# Patient Record
Sex: Female | Born: 1995 | Race: Black or African American | Hispanic: No | Marital: Single | Smoking: Never smoker
Health system: Southern US, Community
[De-identification: ages and names within clinical notes are randomized; demographics above are authoritative.]

## PROBLEM LIST (undated history)

## (undated) HISTORY — PX: GYNECOLOGIC CRYOSURGERY: SHX857

---

## 2017-03-09 ENCOUNTER — Other Ambulatory Visit: Payer: Self-pay

## 2017-03-09 ENCOUNTER — Ambulatory Visit (HOSPITAL_COMMUNITY)
Admission: EM | Admit: 2017-03-09 | Discharge: 2017-03-09 | Disposition: A | Discharge status: Home / Self Care | Payer: 59

## 2017-03-09 ENCOUNTER — Encounter (HOSPITAL_COMMUNITY): Payer: Self-pay | Admitting: Emergency Medicine

## 2017-03-09 DIAGNOSIS — J069 Acute upper respiratory infection, unspecified: Secondary | ICD-10-CM

## 2017-03-09 MED ORDER — BENZONATATE 100 MG PO CAPS: 100.0000 mg | ORAL_CAPSULE | Freq: Three times a day (TID) | ORAL | 0 refills | Status: AC

## 2017-03-09 MED ORDER — ONDANSETRON HCL 4 MG PO TABS: 4.0000 mg | ORAL_TABLET | Freq: Four times a day (QID) | ORAL | 0 refills | Status: AC

## 2017-03-09 MED ORDER — DEXAMETHASONE SODIUM PHOSPHATE 10 MG/ML IJ SOLN
10.0000 mg | Freq: Once | INTRAMUSCULAR | Status: AC
  Administered 2017-03-09: 10 mg via INTRAMUSCULAR

## 2017-03-09 MED ORDER — DEXAMETHASONE SODIUM PHOSPHATE 10 MG/ML IJ SOLN
INTRAMUSCULAR | Status: AC
  Filled 2017-03-09: qty 1

## 2017-03-09 MED ORDER — AMOXICILLIN-POT CLAVULANATE 875-125 MG PO TABS: 1.0000 | ORAL_TABLET | Freq: Two times a day (BID) | ORAL | 0 refills | Status: AC

## 2017-03-09 NOTE — Discharge Instructions (Signed)
Continue to push fluids and take over the counter medications as directed on the back of the box for symptomatic relief.   If symptoms do not improve in 3-4 days start antibiotic

## 2017-03-09 NOTE — ED Provider Notes (Signed)
[facility]    CSN: [id number] Arrival date & time: 03/09/17  1518     History   Chief Complaint Chief Complaint  Patient presents with  . URI  . Vaginitis    HPI Stephanie Keller is a 21 y.o. female.   21 y.o. female presents with nasal congestion, headache, sore throat, subjective fevers, nausea, 1 episode of diarrhea and 1 episode of vomitting X 4 days and vaginal irration X 2 days. Condition is acute in nature. Condition is made better by nothing. Condition is made worse by laying down. Patient denies any treatment prior to there arrival at this facility. Patient declines STD screening at this time.        History reviewed. No pertinent past medical history.  There are no active problems to display for this patient.   Past Surgical History:  Procedure Laterality Date  . GYNECOLOGIC CRYOSURGERY      OB History    No data available       Home Medications    Prior to Admission medications   Medication Sig Start Date End Date Taking? Authorizing Provider  OVER THE COUNTER MEDICATION Oral birth control.   Yes [provider]    Family History History reviewed. No pertinent family history.  Social History Social History   Tobacco Use  . Smoking status: Never Smoker  Substance Use Topics  . Alcohol use: Yes  . Drug use: Yes    Types: Marijuana     Allergies   Patient has no known allergies.   Review of Systems Review of Systems  Constitutional: Positive for fever.  HENT: Positive for congestion, rhinorrhea, sinus pressure and sore throat.   Respiratory: Positive for cough.   Neurological: Positive for headaches.     Physical Exam Triage Vital Signs ED Triage Vitals  Enc Vitals Group     BP 03/09/17 1541 128/79     Pulse Rate 03/09/17 1541 (!) 101     Resp 03/09/17 1541 18     Temp 03/09/17 1541 99.2 F (37.3 C)     Temp Source 03/09/17 1541 Oral     SpO2 03/09/17 1541 99 %     Weight --      Height --      Head  Circumference --      Peak Flow --      Pain Score 03/09/17 1537 2     Pain Loc --      Pain Edu? --      Excl. in GC? --    No data found.  Updated Vital Signs BP 128/79 (BP Location: Left Arm)   Pulse (!) 101   Temp 99.2 F (37.3 C) (Oral)   Resp 18   LMP 02/19/2017   SpO2 99%   Visual Acuity Right Eye Distance:   Left Eye Distance:   Bilateral Distance:    Right Eye Near:   Left Eye Near:    Bilateral Near:     Physical Exam  Constitutional: She is oriented to person, place, and time. She appears well-developed and well-nourished.  HENT:  Head: Normocephalic and atraumatic.  Eyes: Conjunctivae are normal.  Neck: Normal range of motion.  Pulmonary/Chest: Effort normal.  Genitourinary: Vagina normal.  Genitourinary Comments: erythema noted to rectocele.   Musculoskeletal: Normal range of motion.  Neurological: She is alert and oriented to person, place, and time.  Skin: Skin is warm.  Psychiatric: She has a normal mood and affect.  Nursing note and vitals  reviewed.    UC Treatments / Results  Labs (all labs ordered are listed, but only abnormal results are displayed) Labs Reviewed - No data to display  EKG  EKG Interpretation None       Radiology No results found.  Procedures Procedures (including critical care time)  Medications Ordered in UC Medications  dexamethasone (DECADRON) injection 10 mg (not administered)     Initial Impression / Assessment and Plan / UC Course  I have reviewed the triage vital signs and the nursing notes.  Pertinent labs & imaging results that were available during my care of the patient were reviewed by me and considered in my medical decision making (see chart for details).       Final Clinical Impressions(s) / UC Diagnoses   Final diagnoses:  None    ED Discharge Orders    None       Controlled Substance Prescriptions [place] Controlled Substance Registry consulted? Not Applicable   Alene MiresOmohundro,  Jennifer C, NP 03/09/17 1657

## 2017-03-09 NOTE — ED Triage Notes (Signed)
Multiple complaints.    Tuesday started with vomiting, headache and vomiting.  Wednesday started with coughing and runny nose.  Poor appetite.  Coughing up phlegm.  Today started having diarrhea.    Vaginal irritation started 2 days ago.  Describes vaginal area as "raw"

## 2017-03-11 ENCOUNTER — Encounter (HOSPITAL_COMMUNITY): Payer: Self-pay

## 2017-03-11 ENCOUNTER — Other Ambulatory Visit: Payer: Self-pay

## 2017-03-11 ENCOUNTER — Emergency Department (HOSPITAL_COMMUNITY): Payer: 59

## 2017-03-11 ENCOUNTER — Emergency Department (HOSPITAL_COMMUNITY)
Admission: EM | Admit: 2017-03-11 | Discharge: 2017-03-11 | Disposition: A | Discharge status: Home / Self Care | Payer: 59 | Attending: Emergency Medicine | Admitting: Emergency Medicine

## 2017-03-11 DIAGNOSIS — R509 Fever, unspecified: Secondary | ICD-10-CM | POA: Insufficient documentation

## 2017-03-11 DIAGNOSIS — R112 Nausea with vomiting, unspecified: Secondary | ICD-10-CM | POA: Insufficient documentation

## 2017-03-11 DIAGNOSIS — B9789 Other viral agents as the cause of diseases classified elsewhere: Secondary | ICD-10-CM | POA: Diagnosis not present

## 2017-03-11 DIAGNOSIS — R05 Cough: Secondary | ICD-10-CM | POA: Diagnosis not present

## 2017-03-11 DIAGNOSIS — J069 Acute upper respiratory infection, unspecified: Secondary | ICD-10-CM | POA: Insufficient documentation

## 2017-03-11 DIAGNOSIS — F1210 Cannabis abuse, uncomplicated: Secondary | ICD-10-CM | POA: Insufficient documentation

## 2017-03-11 DIAGNOSIS — R070 Pain in throat: Secondary | ICD-10-CM | POA: Diagnosis present

## 2017-03-11 DIAGNOSIS — R0981 Nasal congestion: Secondary | ICD-10-CM | POA: Insufficient documentation

## 2017-03-11 DIAGNOSIS — B309 Viral conjunctivitis, unspecified: Secondary | ICD-10-CM | POA: Diagnosis not present

## 2017-03-11 MED ORDER — IBUPROFEN 200 MG PO TABS
600.0000 mg | ORAL_TABLET | Freq: Once | ORAL | Status: AC
  Administered 2017-03-11: 15:00:00 600 mg via ORAL
  Filled 2017-03-11: qty 1

## 2017-03-11 NOTE — ED Triage Notes (Signed)
Per Pt, [facility], [facility] and [facility]. Reports receiving a steroid and medication. Pt reports not feeling any better and her eyes are swollen bilaterally with some redness.

## 2017-03-11 NOTE — ED Provider Notes (Signed)
[facility]   CSN: [id number] Arrival date & time: 03/11/17  1252     History   Chief Complaint Chief Complaint  Patient presents with  . Nasal Congestion    HPI  Stephanie Keller is a 21 y.o. female no pertinent past medical history, presents complaining of sore throat, cough and nasal congestion for 7 days.  Patient reports some subjective fevers and a few episodes of nausea and vomiting last week.  Patient was seen at urgent care for the symptoms 2 days ago and diagnosed with URI, patient given shot of Decadron and discharged with Tessalon Perles .  Patient reports no more nausea or vomiting, and she has been able to eat and drink normally.  The patient reports minimal improvement in cough or nasal congestion.  She reports she has not been taking anything for the congestion, has not tried any ibuprofen or Tylenol either.  Patient reports this morning she woke up with both of her eyes red and puffy with watery discharge, denies any yellowish discharge or crusting.  No vision changes or pain, reports her eyes just feel bit itchy and irritated.  She denies any shortness of breath or chest pain.  Reports continued cough occasionally productive of mucus.      History reviewed. No pertinent past medical history.  There are no active problems to display for this patient.   Past Surgical History:  Procedure Laterality Date  . GYNECOLOGIC CRYOSURGERY      OB History    No data available       Home Medications    Prior to Admission medications   Medication Sig Start Date End Date Taking? Authorizing Provider  amoxicillin-clavulanate (AUGMENTIN) 875-125 MG tablet Take 1 tablet every 12 (twelve) hours for 5 days by mouth. 03/09/17 03/14/17  Alene Miresmohundro, Jennifer C, NP  benzonatate (TESSALON) 100 MG capsule Take 1 capsule (100 mg total) every 8 (eight) hours by mouth. 03/09/17   Alene Miresmohundro, Jennifer C, NP  ondansetron (ZOFRAN) 4 MG  tablet Take 1 tablet (4 mg total) every 6 (six) hours by mouth. 03/09/17   Alene Miresmohundro, Jennifer C, NP  OVER THE COUNTER MEDICATION Oral birth control.    [provider]    Family History No family history on file.  Social History Social History   Tobacco Use  . Smoking status: Never Smoker  . Smokeless tobacco: Never Used  Substance Use Topics  . Alcohol use: Yes  . Drug use: Yes    Types: Marijuana     Allergies   Patient has no known allergies.   Review of Systems Review of Systems  Constitutional: Positive for chills and fever.  HENT: Positive for congestion, rhinorrhea and sore throat. Negative for ear discharge, ear pain, facial swelling, sinus pressure, sinus pain, trouble swallowing and voice change.   Eyes: Positive for discharge and redness. Negative for photophobia and visual disturbance.  Respiratory: Positive for cough. Negative for chest tightness, shortness of breath and wheezing.   Cardiovascular: Negative for chest pain.  Gastrointestinal: Negative for abdominal pain, nausea and vomiting.  Genitourinary: Negative for dysuria.  Musculoskeletal: Negative for arthralgias and myalgias.  Skin: Negative for rash.  Neurological: Negative for light-headedness and headaches.     Physical Exam Updated Vital Signs BP 120/70 (BP Location: Right Arm)   Pulse 95   Temp 99.3 F (37.4 C) (Oral)   Resp 16   Ht 5\' 2"  (1.575 m)   Wt 52.2 kg (115 lb)  LMP 02/19/2017   SpO2 100%   BMI 21.03 kg/m   Physical Exam  Constitutional: She appears well-developed and well-nourished. No distress.  HENT:  Head: Normocephalic and atraumatic.  TMs clear with good landmarks, moderate nasal mucosa edema with clear rhinorrhea, posterior oropharynx clear and moist, with some erythema,and minimal edema, no exudates  Eyes: EOM are normal. Pupils are equal, round, and reactive to light. Right eye exhibits discharge. Left eye exhibits discharge. Right conjunctiva is  injected. Left conjunctiva is injected.  Bilateral eyes with mild lid swelling, bilateral conjunctival injection, with some watery discharge present, no yellow discharge or crusting, no pain with extraocular movements, no consensual pain  Neck: Normal range of motion. Neck supple.  Cardiovascular: Normal rate, regular rhythm and normal heart sounds.  Pulmonary/Chest: Effort normal and breath sounds normal. No stridor. No respiratory distress. She has no wheezes. She has no rales.  Abdominal: Soft. Bowel sounds are normal. She exhibits no distension. There is no tenderness. There is no guarding.  Lymphadenopathy:    She has no cervical adenopathy.  Neurological: She is alert. Coordination normal.  Skin: Skin is warm and dry. Capillary refill takes less than 2 seconds. She is not diaphoretic.  Psychiatric: She has a normal mood and affect. Her behavior is normal.  Nursing note and vitals reviewed.    ED Treatments / Results  Labs (all labs ordered are listed, but only abnormal results are displayed) Labs Reviewed - No data to display  EKG  EKG Interpretation None       Radiology Dg Chest 2 View  Result Date: 03/11/2017 CLINICAL DATA:  Cough. EXAM: CHEST  2 VIEW COMPARISON:  None. FINDINGS: The heart size and mediastinal contours are within normal limits. Both lungs are clear. No pneumothorax or pleural effusion is noted. The visualized skeletal structures are unremarkable. IMPRESSION: No active cardiopulmonary disease. Electronically Signed   By: Lupita RaiderJames  Green Jr, M.D.   On: 03/11/2017 14:34    Procedures Procedures (including critical care time)  Medications Ordered in ED Medications  ibuprofen (ADVIL,MOTRIN) tablet 600 mg (600 mg Oral Given 03/11/17 1443)     Initial Impression / Assessment and Plan / ED Course  I have reviewed the triage vital signs and the nursing notes.  Pertinent labs & imaging results that were available during my care of the patient were reviewed by  me and considered in my medical decision making (see chart for details).  Pt CXR negative for acute infiltrate. Patients symptoms are consistent with URI, likely viral etiology.  Bilateral conjunctival erythema and puffiness, with watery discharge in conjunction with these URI symptoms suggestive of viral conjunctivitis, no visual disturbance or pain.  Discussed that antibiotics are not indicated for viral infections. Pt will be discharged with symptomatic treatment, discussed specifically using Sudafed and Flonase to help with nasal congestion which is likely contributing to sore throat and cough.  Return precautions discussed.  Verbalizes understanding and is agreeable with plan. Pt is hemodynamically stable & in NAD prior to dc.   Final Clinical Impressions(s) / ED Diagnoses   Final diagnoses:  Viral URI with cough  Acute viral conjunctivitis of both eyes    ED Discharge Orders    None       Dartha LodgeFord, Kelsey N, [place]PA-C 03/11/17 1733    Pricilla LovelessGoldston, Scott, MD 03/11/17 2112

## 2017-03-11 NOTE — Discharge Instructions (Signed)
Start using Flonase and Sudafed to treat nasal congestion, you may also try a Nettie pot for nasal flushing.  You can continue to treat cough with Tessalon Perles.  Ibuprofen or Tylenol for fevers or pain.  Redness in your eyes is likely related to this virus and should resolve as symptoms improve, you can use cold saline eyedrops to help with eye irritation, if this does not get better please return to urgent care or your primary care provider.  If you develop persistent fevers, shortness of breath, chest pain or other new or concerning symptoms please return to the ED for sooner reevaluation.

## 2018-11-10 IMAGING — CR DG CHEST 2V
2 series · 2 of 2 positions shown · non-contrast
Comparison: None.

CLINICAL DATA: Cough.

EXAM:
CHEST  2 VIEW

[chest pa]
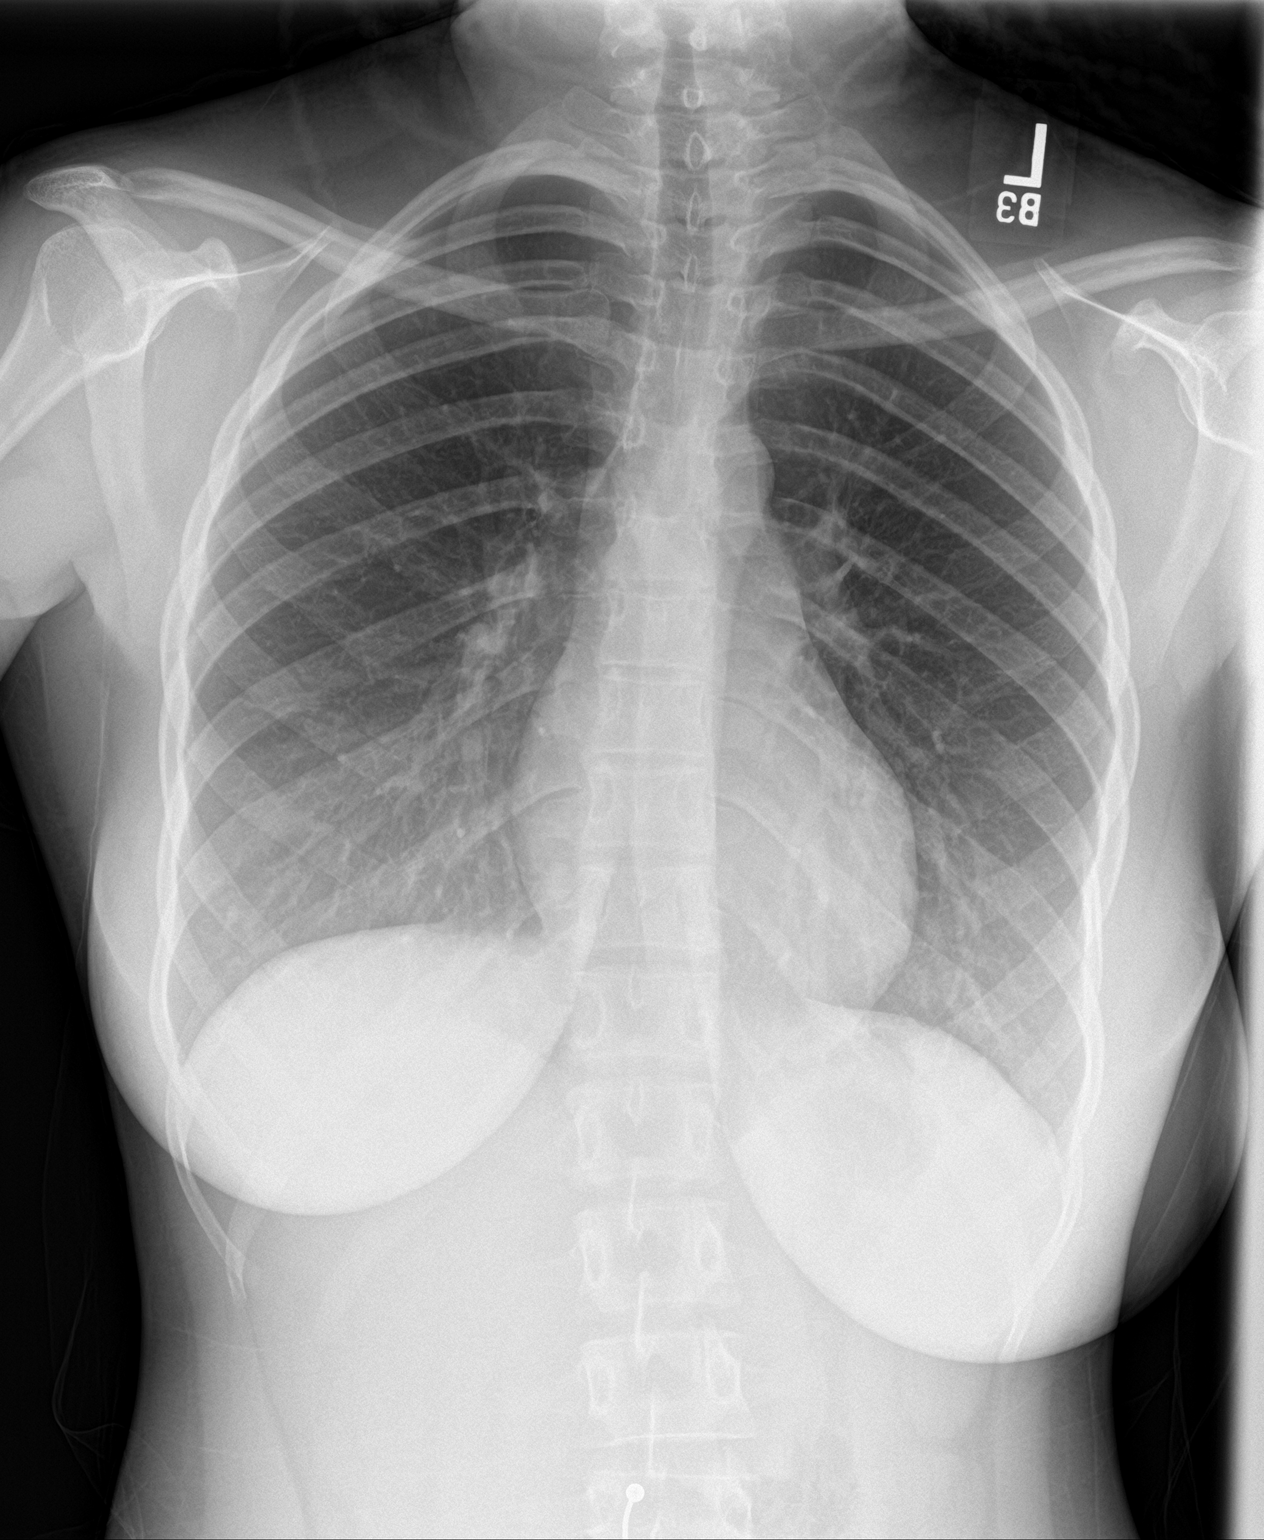

[chest lat]
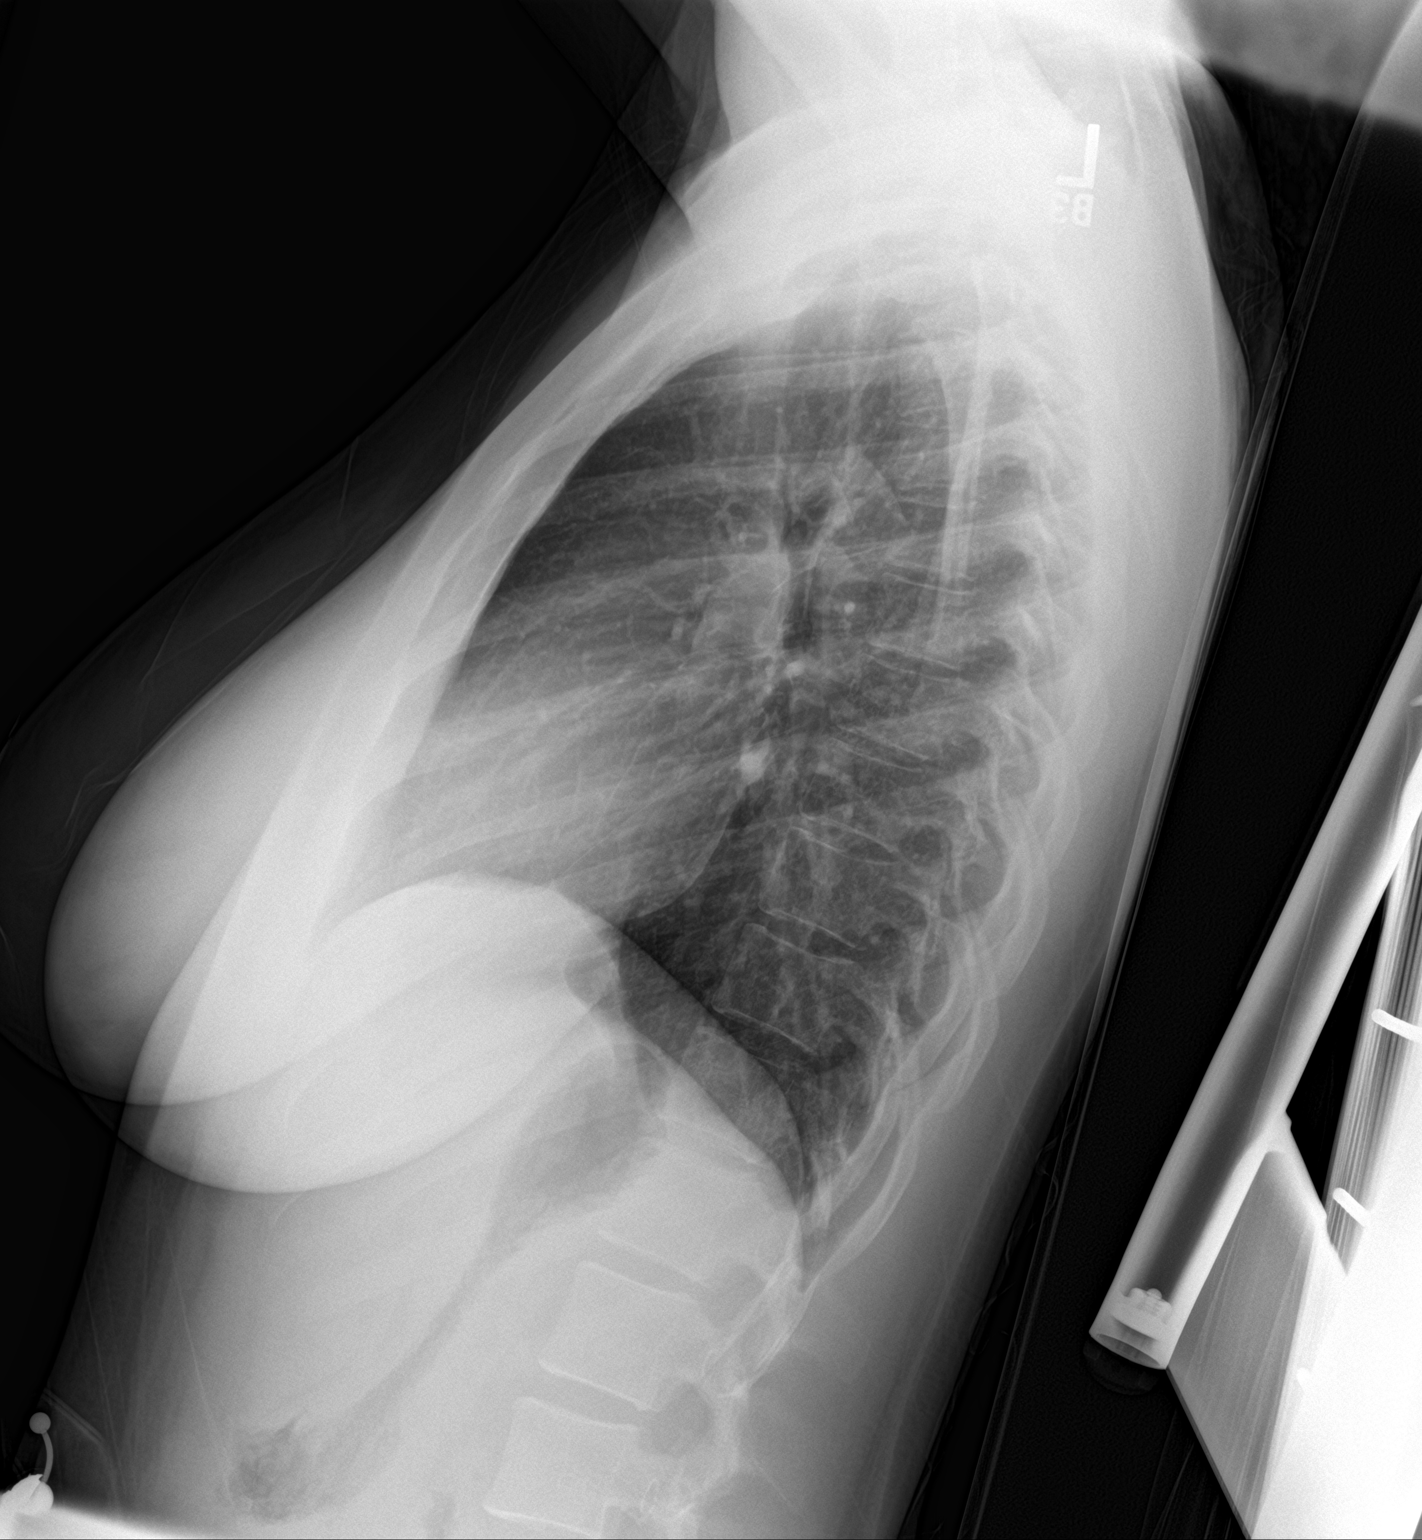

[2 of 2 positions shown; findings below may reference images not displayed]

FINDINGS: The heart size and mediastinal contours are within normal limits.
Both lungs are clear. No pneumothorax or pleural effusion is noted.
The visualized skeletal structures are unremarkable.
IMPRESSION: No active cardiopulmonary disease.
